# Patient Record
Sex: Male | Born: 1990 | Race: Black or African American | Hispanic: No | Marital: Single | Smoking: Current some day smoker
Health system: Southern US, Community
[De-identification: ages and names within clinical notes are randomized; demographics above are authoritative.]

## PROBLEM LIST (undated history)

## (undated) DIAGNOSIS — Z789 Other specified health status: Secondary | ICD-10-CM

---
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
## 2012-03-10 ENCOUNTER — Emergency Department (HOSPITAL_COMMUNITY)
Admission: EM | Admit: 2012-03-10 | Discharge: 2012-03-10 | Disposition: A | Discharge status: Home / Self Care | Payer: Self-pay | Attending: Emergency Medicine | Admitting: Emergency Medicine

## 2012-03-10 ENCOUNTER — Encounter (HOSPITAL_COMMUNITY): Payer: Self-pay | Admitting: *Deleted

## 2012-03-10 DIAGNOSIS — F17200 Nicotine dependence, unspecified, uncomplicated: Secondary | ICD-10-CM | POA: Insufficient documentation

## 2012-03-10 DIAGNOSIS — J309 Allergic rhinitis, unspecified: Secondary | ICD-10-CM | POA: Insufficient documentation

## 2012-03-10 MED ORDER — LORATADINE 10 MG PO TABS: 10.0000 mg | ORAL_TABLET | Freq: Every day | ORAL | Status: DC

## 2012-03-10 MED ORDER — LORATADINE 10 MG PO TABS
10.0000 mg | ORAL_TABLET | Freq: Once | ORAL | Status: DC
  Filled 2012-03-10 (×3): qty 1

## 2012-03-10 NOTE — ED Provider Notes (Signed)
History   This chart was scribed for Loren Racer, MD by Charolett Bumpers . The patient was seen in room TR04C/TR04C. Patient's care was started at 1336.    CSN: [id number]  Arrival date & time 03/10/12  1232   First MD Initiated Contact with Patient 03/10/12 1336      Chief Complaint  Patient presents with  . Sore Throat  . Nasal Congestion  . Rash    (Consider location/radiation/quality/duration/timing/severity/associated sxs/prior treatment) HPI Omar Chapman is a 21 y.o. male who presents to the Emergency Department complaining of constant, mild nasal congestion with associated throat irritation, rhinorrhea for the past 2 days ago. Pt also states that he had hives with associated itching and swelling last night located on his face. Pt reports recent exposure to dust bunnies and states that he tried a new soap yesterday. Pt states that he has not taken anything for his symptoms.   History reviewed. No pertinent past medical history.  History reviewed. No pertinent past surgical history.  History reviewed. No pertinent family history.  History  Substance Use Topics  . Smoking status: Current Some Day Smoker  . Smokeless tobacco: Not on file  . Alcohol Use: Yes     occ      Review of Systems A complete 10 system review of systems was obtained and all systems are negative except as noted in the HPI and PMH.   Allergies  Review of patient's allergies indicates no known allergies.  Home Medications   Current Outpatient Rx  Name Route Sig Dispense Refill  . LORATADINE 10 MG PO TABS Oral Take 1 tablet (10 mg total) by mouth daily. 20 tablet 0    BP 135/79  Pulse 60  Temp 98.1 F (36.7 C) (Oral)  Resp 16  SpO2 98%  Physical Exam  Nursing note and vitals reviewed. Constitutional: He is oriented to person, place, and time. He appears well-developed and well-nourished. No distress.  HENT:  Head: Normocephalic and atraumatic.  Mouth/Throat: Oropharynx is  clear and moist. No oropharyngeal exudate.       Swollen bilateral nasal turbinates.   Eyes: EOM are normal.  Neck: Normal range of motion. Neck supple. No tracheal deviation present.  Cardiovascular: Normal rate, regular rhythm and normal heart sounds.   Pulmonary/Chest: Effort normal and breath sounds normal. No respiratory distress. He has no wheezes.  Musculoskeletal: Normal range of motion.  Neurological: He is alert and oriented to person, place, and time.  Skin: Skin is warm and dry. No rash noted.       No evidence of any obvious rashes.   Psychiatric: He has a normal mood and affect. His behavior is normal.    ED Course  Procedures (including critical care time)  DIAGNOSTIC STUDIES: Oxygen Saturation is 98% on room air, normal by my interpretation.    COORDINATION OF CARE:  13:47-Discussed planned course of treatment with the patient including Claritin, who is agreeable at this time.   14:00-Medication Orders: Loratadine (Claritin) tablet 10 mg-once  Results for orders placed during the hospital encounter of 03/10/12  RAPID STREP SCREEN      Component Value Range   Streptococcus, Group A Screen (Direct) NEGATIVE  NEGATIVE   No results found.   1. Allergic rhinitis       MDM  I personally performed the services described in this documentation, which was scribed in my presence. The recorded information has been reviewed and considered.      Loren Racer, MD  03/10/12 1906 

## 2012-03-10 NOTE — ED Notes (Signed)
Pt reports nasal congestion and sore throat since this am, no fever no cough. Pt reports broke out in what he thought was hives this am. Airway intact.

## 2012-03-15 ENCOUNTER — Encounter (HOSPITAL_COMMUNITY): Payer: Self-pay | Admitting: Emergency Medicine

## 2012-03-15 DIAGNOSIS — F17200 Nicotine dependence, unspecified, uncomplicated: Secondary | ICD-10-CM | POA: Insufficient documentation

## 2012-03-15 DIAGNOSIS — K3580 Unspecified acute appendicitis: Secondary | ICD-10-CM | POA: Insufficient documentation

## 2012-03-15 LAB — COMPREHENSIVE METABOLIC PANEL
ALT: 10 U/L (ref 0–53)
AST: 17 U/L (ref 0–37)
Albumin: 4.5 g/dL (ref 3.5–5.2)
Alkaline Phosphatase: 64 U/L (ref 39–117)
BUN: 8 mg/dL (ref 6–23)
CO2: 25 mEq/L (ref 19–32)
Calcium: 10.3 mg/dL (ref 8.4–10.5)
Chloride: 100 mEq/L (ref 96–112)
Creatinine, Ser: 0.86 mg/dL (ref 0.50–1.35)
GFR calc Af Amer: >90 mL/min (ref >90)
GFR calc non Af Amer: >90 mL/min (ref >90)
Glucose, Bld: 89 mg/dL (ref 70–99)
Potassium: 3.6 mEq/L (ref 3.5–5.1)
Sodium: 139 mEq/L (ref 135–145)
Total Bilirubin: 0.8 mg/dL (ref 0.3–1.2)
Total Protein: 8.6 g/dL — ABNORMAL HIGH (ref 6.0–8.3)

## 2012-03-15 LAB — CBC WITH DIFFERENTIAL/PLATELET
Basophils Absolute: 0 10*3/uL (ref 0.0–0.1)
Basophils Relative: 0 % (ref 0–1)
Eosinophils Absolute: 0 10*3/uL (ref 0.0–0.7)
Eosinophils Relative: 0 % (ref 0–5)
HCT: 43.9 % (ref 39.0–52.0)
Hemoglobin: 15.7 g/dL (ref 13.0–17.0)
Lymphocytes Relative: 12 % (ref 12–46)
Lymphs Abs: 1.8 10*3/uL (ref 0.7–4.0)
MCH: 31.6 pg (ref 26.0–34.0)
MCHC: 35.8 g/dL (ref 30.0–36.0)
MCV: 88.3 fL (ref 78.0–100.0)
Monocytes Absolute: 1 10*3/uL (ref 0.1–1.0)
Monocytes Relative: 6 % (ref 3–12)
Neutro Abs: 12.5 10*3/uL — ABNORMAL HIGH (ref 1.7–7.7)
Neutrophils Relative %: 82 % — ABNORMAL HIGH (ref 43–77)
Platelets: 267 10*3/uL (ref 150–400)
RBC: 4.97 MIL/uL (ref 4.22–5.81)
RDW: 12.6 % (ref 11.5–15.5)
WBC: 15.4 10*3/uL — ABNORMAL HIGH (ref 4.0–10.5)

## 2012-03-15 LAB — URINALYSIS, MICROSCOPIC ONLY
Hgb urine dipstick: NEGATIVE
Ketones, ur: >80 mg/dL — AB
Protein, ur: 30 mg/dL — AB
Urobilinogen, UA: 0.2 mg/dL (ref 0.0–1.0)

## 2012-03-15 LAB — LIPASE, BLOOD: Lipase: 13 U/L (ref 11–59)

## 2012-03-15 NOTE — ED Notes (Signed)
Pt reports L groin pain radiates to LLQ pain; reports "little bit" of nausea, denies v/d; reports pain increases when uses bathroom

## 2012-03-16 ENCOUNTER — Encounter (HOSPITAL_COMMUNITY): Payer: Self-pay | Admitting: General Practice

## 2012-03-16 ENCOUNTER — Encounter (HOSPITAL_COMMUNITY): Payer: Self-pay | Admitting: Anesthesiology

## 2012-03-16 ENCOUNTER — Ambulatory Visit (HOSPITAL_COMMUNITY)
Admission: EM | Admit: 2012-03-16 | Discharge: 2012-03-17 | Disposition: A | Discharge status: Home / Self Care | Payer: Self-pay | Attending: General Surgery | Admitting: General Surgery

## 2012-03-16 ENCOUNTER — Emergency Department (HOSPITAL_COMMUNITY): Payer: Self-pay | Admitting: Anesthesiology

## 2012-03-16 ENCOUNTER — Emergency Department (HOSPITAL_COMMUNITY): Payer: Self-pay

## 2012-03-16 ENCOUNTER — Encounter (HOSPITAL_COMMUNITY)
Admission: EM | Disposition: A | Discharge status: Home / Self Care | Payer: Self-pay | Source: Home / Self Care | Attending: Emergency Medicine

## 2012-03-16 ENCOUNTER — Encounter (HOSPITAL_COMMUNITY): Payer: Self-pay | Admitting: General Surgery

## 2012-03-16 DIAGNOSIS — K3580 Unspecified acute appendicitis: Secondary | ICD-10-CM

## 2012-03-16 HISTORY — PX: APPENDECTOMY: SHX54

## 2012-03-16 HISTORY — PX: LAPAROSCOPIC APPENDECTOMY: SHX408

## 2012-03-16 HISTORY — DX: Other specified health status: Z78.9

## 2012-03-16 LAB — CBC
HCT: 38.2 % — ABNORMAL LOW (ref 39.0–52.0)
Hemoglobin: 13.3 g/dL (ref 13.0–17.0)
MCH: 30.9 pg (ref 26.0–34.0)
MCHC: 34.8 g/dL (ref 30.0–36.0)
MCV: 88.8 fL (ref 78.0–100.0)
Platelets: 220 10*3/uL (ref 150–400)
RBC: 4.3 MIL/uL (ref 4.22–5.81)
RDW: 12.6 % (ref 11.5–15.5)
WBC: 13 10*3/uL — ABNORMAL HIGH (ref 4.0–10.5)

## 2012-03-16 SURGERY — LAPAROSCOPIC TORSION OF APPENDIX EXCISION OF INFRACTED EPIPLOIC
Anesthesia: General

## 2012-03-16 SURGERY — APPENDECTOMY, LAPAROSCOPIC
Anesthesia: General | Site: Abdomen | Wound class: Clean Contaminated

## 2012-03-16 MED ORDER — HYDROMORPHONE HCL PF 1 MG/ML IJ SOLN
1.0000 mg | Freq: Once | INTRAMUSCULAR | Status: AC
  Administered 2012-03-16: 1 mg via INTRAVENOUS
  Filled 2012-03-16: qty 1

## 2012-03-16 MED ORDER — ONDANSETRON HCL 4 MG/2ML IJ SOLN
4.0000 mg | Freq: Four times a day (QID) | INTRAMUSCULAR | Status: DC | PRN
  Administered 2012-03-16: 4 mg via INTRAVENOUS
  Filled 2012-03-16 (×2): qty 2

## 2012-03-16 MED ORDER — NEOSTIGMINE METHYLSULFATE 1 MG/ML IJ SOLN
INTRAMUSCULAR | Status: DC | PRN
  Administered 2012-03-16: 3 mg via INTRAVENOUS

## 2012-03-16 MED ORDER — PROMETHAZINE HCL 25 MG/ML IJ SOLN: 6.2500 mg | INTRAMUSCULAR | Status: DC | PRN

## 2012-03-16 MED ORDER — ONDANSETRON HCL 4 MG PO TABS: 4.0000 mg | ORAL_TABLET | Freq: Four times a day (QID) | ORAL | Status: DC | PRN

## 2012-03-16 MED ORDER — PROPOFOL 10 MG/ML IV BOLUS
INTRAVENOUS | Status: DC | PRN
  Administered 2012-03-16: 170 mg via INTRAVENOUS

## 2012-03-16 MED ORDER — BUPIVACAINE-EPINEPHRINE 0.25% -1:200000 IJ SOLN
INTRAMUSCULAR | Status: DC | PRN
  Administered 2012-03-16: 14 mL

## 2012-03-16 MED ORDER — FENTANYL CITRATE 0.05 MG/ML IJ SOLN
INTRAMUSCULAR | Status: DC | PRN
  Administered 2012-03-16: 150 ug via INTRAVENOUS
  Administered 2012-03-16 (×2): 50 ug via INTRAVENOUS
  Administered 2012-03-16: 100 ug via INTRAVENOUS

## 2012-03-16 MED ORDER — OXYCODONE HCL 5 MG/5ML PO SOLN: 5.0000 mg | Freq: Once | ORAL | Status: DC | PRN

## 2012-03-16 MED ORDER — ONDANSETRON HCL 4 MG/2ML IJ SOLN
INTRAMUSCULAR | Status: DC | PRN
  Administered 2012-03-16: 4 mg via INTRAVENOUS

## 2012-03-16 MED ORDER — SODIUM CHLORIDE 0.9 % IR SOLN
Status: DC | PRN
  Administered 2012-03-16: 1000 mL

## 2012-03-16 MED ORDER — LIDOCAINE HCL (CARDIAC) 20 MG/ML IV SOLN
INTRAVENOUS | Status: DC | PRN
  Administered 2012-03-16: 80 mg via INTRAVENOUS

## 2012-03-16 MED ORDER — SODIUM CHLORIDE 0.9 % IV SOLN
INTRAVENOUS | Status: DC | PRN
  Administered 2012-03-16: 06:00:00 via INTRAVENOUS

## 2012-03-16 MED ORDER — SODIUM CHLORIDE 0.9 % IV SOLN
3.0000 g | Freq: Once | INTRAVENOUS | Status: AC
  Administered 2012-03-16: 3 g via INTRAVENOUS
  Filled 2012-03-16: qty 3

## 2012-03-16 MED ORDER — HYDROMORPHONE HCL PF 1 MG/ML IJ SOLN
INTRAMUSCULAR | Status: AC
  Filled 2012-03-16: qty 1

## 2012-03-16 MED ORDER — KCL IN DEXTROSE-NACL 20-5-0.9 MEQ/L-%-% IV SOLN
INTRAVENOUS | Status: DC
  Administered 2012-03-16: 11:00:00 via INTRAVENOUS
  Filled 2012-03-16 (×3): qty 1000

## 2012-03-16 MED ORDER — DEXTROSE 5 % IV SOLN
2.0000 g | INTRAVENOUS | Status: DC | PRN
  Administered 2012-03-16: 2 g via INTRAVENOUS

## 2012-03-16 MED ORDER — HYDROMORPHONE HCL PF 1 MG/ML IJ SOLN
0.2500 mg | INTRAMUSCULAR | Status: DC | PRN
  Administered 2012-03-16 (×2): 0.5 mg via INTRAVENOUS

## 2012-03-16 MED ORDER — SUCCINYLCHOLINE CHLORIDE 20 MG/ML IJ SOLN
INTRAMUSCULAR | Status: DC | PRN
  Administered 2012-03-16: 100 mg via INTRAVENOUS

## 2012-03-16 MED ORDER — SODIUM CHLORIDE 0.9 % IV BOLUS (SEPSIS)
1000.0000 mL | INTRAVENOUS | Status: AC
  Administered 2012-03-16: 1000 mL via INTRAVENOUS

## 2012-03-16 MED ORDER — MEPERIDINE HCL 25 MG/ML IJ SOLN: 6.2500 mg | INTRAMUSCULAR | Status: DC | PRN

## 2012-03-16 MED ORDER — IOHEXOL 300 MG/ML  SOLN
100.0000 mL | Freq: Once | INTRAMUSCULAR | Status: AC | PRN
  Administered 2012-03-16: 100 mL via INTRAVENOUS

## 2012-03-16 MED ORDER — GLYCOPYRROLATE 0.2 MG/ML IJ SOLN
INTRAMUSCULAR | Status: DC | PRN
  Administered 2012-03-16 (×2): 0.2 mg via INTRAVENOUS
  Administered 2012-03-16: 0.4 mg via INTRAVENOUS

## 2012-03-16 MED ORDER — OXYCODONE HCL 5 MG PO TABS: 5.0000 mg | ORAL_TABLET | Freq: Once | ORAL | Status: DC | PRN

## 2012-03-16 MED ORDER — BUPIVACAINE-EPINEPHRINE PF 0.25-1:200000 % IJ SOLN
INTRAMUSCULAR | Status: AC
  Filled 2012-03-16: qty 30

## 2012-03-16 MED ORDER — LACTATED RINGERS IV SOLN
INTRAVENOUS | Status: DC | PRN
  Administered 2012-03-16: 06:00:00 via INTRAVENOUS

## 2012-03-16 MED ORDER — MORPHINE SULFATE 4 MG/ML IJ SOLN: 4.0000 mg | INTRAMUSCULAR | Status: DC | PRN

## 2012-03-16 MED ORDER — HYDROCODONE-ACETAMINOPHEN 5-325 MG PO TABS
1.0000 | ORAL_TABLET | ORAL | Status: DC | PRN
  Administered 2012-03-16 (×2): 2 via ORAL
  Filled 2012-03-16 (×2): qty 2

## 2012-03-16 MED ORDER — IOHEXOL 300 MG/ML  SOLN
20.0000 mL | INTRAMUSCULAR | Status: AC
  Administered 2012-03-16: 20 mL via ORAL

## 2012-03-16 MED ORDER — ONDANSETRON HCL 4 MG/2ML IJ SOLN
4.0000 mg | Freq: Four times a day (QID) | INTRAMUSCULAR | Status: DC | PRN
  Administered 2012-03-16: 4 mg via INTRAVENOUS

## 2012-03-16 MED ORDER — ROCURONIUM BROMIDE 100 MG/10ML IV SOLN
INTRAVENOUS | Status: DC | PRN
  Administered 2012-03-16: 40 mg via INTRAVENOUS

## 2012-03-16 MED ORDER — MIDAZOLAM HCL 5 MG/5ML IJ SOLN
INTRAMUSCULAR | Status: DC | PRN
  Administered 2012-03-16: 2 mg via INTRAVENOUS

## 2012-03-16 SURGICAL SUPPLY — 35 items
APPLIER CLIP ROT 10 11.4 M/L (STAPLE)
BLADE SURG ROTATE 9660 (MISCELLANEOUS) IMPLANT
CANISTER SUCTION 2500CC (MISCELLANEOUS) ×2 IMPLANT
CHLORAPREP W/TINT 26ML (MISCELLANEOUS) ×2 IMPLANT
CLIP APPLIE ROT 10 11.4 M/L (STAPLE) IMPLANT
CLOTH BEACON ORANGE TIMEOUT ST (SAFETY) ×2 IMPLANT
COVER SURGICAL LIGHT HANDLE (MISCELLANEOUS) ×2 IMPLANT
CUTTER FLEX LINEAR 45M (STAPLE) ×2 IMPLANT
DECANTER SPIKE VIAL GLASS SM (MISCELLANEOUS) ×2 IMPLANT
DERMABOND ADVANCED (GAUZE/BANDAGES/DRESSINGS) ×1
DERMABOND ADVANCED .7 DNX12 (GAUZE/BANDAGES/DRESSINGS) ×1 IMPLANT
DRAPE UTILITY 15X26 W/TAPE STR (DRAPE) ×4 IMPLANT
ELECT REM PT RETURN 9FT ADLT (ELECTROSURGICAL) ×2
ELECTRODE REM PT RTRN 9FT ADLT (ELECTROSURGICAL) ×1 IMPLANT
ENDOLOOP SUT PDS II  0 18 (SUTURE)
ENDOLOOP SUT PDS II 0 18 (SUTURE) IMPLANT
GLOVE BIO SURGEON STRL SZ7.5 (GLOVE) ×2 IMPLANT
GOWN STRL NON-REIN LRG LVL3 (GOWN DISPOSABLE) ×4 IMPLANT
KIT BASIN OR (CUSTOM PROCEDURE TRAY) ×2 IMPLANT
KIT ROOM TURNOVER OR (KITS) ×2 IMPLANT
NS IRRIG 1000ML POUR BTL (IV SOLUTION) ×2 IMPLANT
PAD ARMBOARD 7.5X6 YLW CONV (MISCELLANEOUS) ×4 IMPLANT
POUCH SPECIMEN RETRIEVAL 10MM (ENDOMECHANICALS) ×2 IMPLANT
RELOAD STAPLE TA45 3.5 REG BLU (ENDOMECHANICALS) ×2 IMPLANT
SCALPEL HARMONIC ACE (MISCELLANEOUS) ×2 IMPLANT
SET IRRIG TUBING LAPAROSCOPIC (IRRIGATION / IRRIGATOR) ×2 IMPLANT
SPECIMEN JAR SMALL (MISCELLANEOUS) ×2 IMPLANT
SUT MNCRL AB 4-0 PS2 18 (SUTURE) ×2 IMPLANT
TOWEL OR 17X24 6PK STRL BLUE (TOWEL DISPOSABLE) ×2 IMPLANT
TOWEL OR 17X26 10 PK STRL BLUE (TOWEL DISPOSABLE) ×2 IMPLANT
TRAY FOLEY CATH 14FR (SET/KITS/TRAYS/PACK) ×2 IMPLANT
TRAY LAPAROSCOPIC (CUSTOM PROCEDURE TRAY) ×2 IMPLANT
TROCAR XCEL BLUNT TIP 100MML (ENDOMECHANICALS) ×2 IMPLANT
TROCAR XCEL NON-BLD 5MMX100MML (ENDOMECHANICALS) ×4 IMPLANT
WATER STERILE IRR 1000ML POUR (IV SOLUTION) ×2 IMPLANT

## 2012-03-16 NOTE — Progress Notes (Signed)
Day of Surgery  Subjective: States trhat he feels much better, no abdominal pain - BS or flatus. Has not eaten or ambulated yet.  Objective: Vital signs in last 24 hours: Temp:  [97.5 F (36.4 C)-99 F (37.2 C)] 98.2 F (36.8 C) (09/19 0930) Pulse Rate:  [48-63] 48  (09/19 0930) Resp:  [9-25] 16  (09/19 0930) BP: (107-136)/(46-72) 124/68 mmHg (09/19 0930) SpO2:  [98 %-100 %] 98 % (09/19 0847) Weight:  [150 lb (68.04 kg)] 150 lb (68.04 kg) (09/19 0105) Last BM Date: 03/15/12  Intake/Output from previous day: 09/18 0701 - 09/19 0700 In: 300 [I.V.:300] Out: -  Intake/Output this shift: Total I/O In: 800 [I.V.:800] Out: -   General appearance: alert, cooperative, appears stated age and no distress Chest: CTA bilaterally Cardiac: RRR, No M/R/G Abdomen: Soft, appropriately tender around surgical wound sites, no erythema, drainage or bloating, No BS, or flatus. Denies any N/V He has not eaten yet. VS: HD stable, afebrile WBC trending downward, H&H slight downward trend (likley dilutional)   Lab Results:   [facility] 03/16/12 1336 03/15/12 2059  WBC 13.0* 15.4*  HGB 13.3 15.7  HCT 38.2* 43.9  PLT 220 267   BMET  Basename 03/15/12 2059  NA 139  K 3.6  CL 100  CO2 25  GLUCOSE 89  BUN 8  CREATININE 0.86  CALCIUM 10.3   PT/INR No results found for this basename: LABPROT:2,INR:2 in the last 72 hours ABG No results found for this basename: PHART:2,PCO2:2,PO2:2,HCO3:2 in the last 72 hours  Studies/Results: Ct Abdomen Pelvis W Contrast  03/16/2012  *RADIOLOGY REPORT*  Clinical Data: Right side abdominal pain.  CT ABDOMEN AND PELVIS WITH CONTRAST  Technique:  Multidetector CT imaging of the abdomen and pelvis was performed following the standard protocol during bolus administration of intravenous contrast.  Contrast: OMNIPAQUE IOHEXOL 300 MG/ML  SOLN  Comparison: None.  Findings: Lung bases are clear.  No pleural or pleural effusion.  The patient has acute  appendicitis.  The appendix is dilated with periappendiceal inflammatory change and an appendicolith identified.  There is a fairly large volume of free pelvic fluid. No abscess is seen.  No free intraperitoneal air is identified. The stomach, small bowel and colon appear normal.  No lymphadenopathy is identified.  The gallbladder, liver, adrenal glands, spleen, pancreas and kidneys all appear normal.  There is no focal bony abnormality.  IMPRESSION: Study is positive for acute appendicitis without abscess. Although no free air is identified, the volume of free pelvic fluid is greater than typically seen in acute appendicitis raising the possibility of perforation.   Original Report Authenticated By: Bernadene Bell. Maricela Curet, M.D.     Anti-infectives: Anti-infectives     Start     Dose/Rate Route Frequency Ordered Stop   03/16/12 0330   Ampicillin-Sulbactam (UNASYN) 3 g in sodium chloride 0.9 % 100 mL IVPB        3 g 100 mL/hr over 60 Minutes Intravenous  Once 03/16/12 0321 03/16/12 0449          Assessment/Plan: s/p Procedure(s) (LRB) with comments: LAPAROSCOPIC TORSION OF APPENDIX EXCISION OF INFRACTED EPIPLOIC (N/A)  1. Mobilize 2. Encourage diet/po intake, continue IVF for now. 3. Minimize use of narcotics 4. Probable discharge in am if patient has return of Bowel function, and looks good clinically.     LOS: 0 days    Omar Chapman 03/16/2012

## 2012-03-16 NOTE — Progress Notes (Signed)
ARRIVED TO ROOM [id number]. A/OX4, MOVES SELF FROM STRETCHER TO BED WITHOUT DIFFICULTY, DENIES NAUSEA/PAIN, ORIENTED TO ROOM, FAMILY AT BEDSIDE

## 2012-03-16 NOTE — Progress Notes (Signed)
Patient seen and examined.  Having some postop n/v currently.  Keep on clear liquids for now.

## 2012-03-16 NOTE — H&P (Signed)
Omar Chapman is an 21 y.o. male.   Chief Complaint: abd pain HPI: 21 yo bm who presents to er with rlq pain that started last night. Pain has gotten worse. Denies nausea or vomiting. Denies fever or chills  History reviewed. No pertinent past medical history.  History reviewed. No pertinent past surgical history.  History reviewed. No pertinent family history. Social History:  reports that he has been smoking.  He does not have any smokeless tobacco history on file. He reports that he drinks alcohol. He reports that he does not use illicit drugs.  Allergies: No Known Allergies   (Not in a hospital admission)  Results for orders placed during the hospital encounter of 03/16/12 (from the past 48 hour(s))  CBC WITH DIFFERENTIAL     Status: Abnormal   Collection Time   03/15/12  8:59 PM      Component Value Range Comment   WBC 15.4 (*) 4.0 - 10.5 K/uL    RBC 4.97  4.22 - 5.81 MIL/uL    Hemoglobin 15.7  13.0 - 17.0 g/dL    HCT 16.1  09.6 - 04.5 %    MCV 88.3  78.0 - 100.0 fL    MCH 31.6  26.0 - 34.0 pg    MCHC 35.8  30.0 - 36.0 g/dL    RDW 40.9  81.1 - 91.4 %    Platelets 267  150 - 400 K/uL    Neutrophils Relative 82 (*) 43 - 77 %    Neutro Abs 12.5 (*) 1.7 - 7.7 K/uL    Lymphocytes Relative 12  12 - 46 %    Lymphs Abs 1.8  0.7 - 4.0 K/uL    Monocytes Relative 6  3 - 12 %    Monocytes Absolute 1.0  0.1 - 1.0 K/uL    Eosinophils Relative 0  0 - 5 %    Eosinophils Absolute 0.0  0.0 - 0.7 K/uL    Basophils Relative 0  0 - 1 %    Basophils Absolute 0.0  0.0 - 0.1 K/uL   COMPREHENSIVE METABOLIC PANEL     Status: Abnormal   Collection Time   03/15/12  8:59 PM      Component Value Range Comment   Sodium 139  135 - 145 mEq/L    Potassium 3.6  3.5 - 5.1 mEq/L    Chloride 100  96 - 112 mEq/L    CO2 25  19 - 32 mEq/L    Glucose, Bld 89  70 - 99 mg/dL    BUN 8  6 - 23 mg/dL    Creatinine, Ser 7.82  0.50 - 1.35 mg/dL    Calcium 95.6  8.4 - 10.5 mg/dL    Total Protein 8.6 (*) 6.0 -  8.3 g/dL    Albumin 4.5  3.5 - 5.2 g/dL    AST 17  0 - 37 U/L    ALT 10  0 - 53 U/L    Alkaline Phosphatase 64  39 - 117 U/L    Total Bilirubin 0.8  0.3 - 1.2 mg/dL    GFR calc non Af Amer >90  >90 mL/min    GFR calc Af Amer >90  >90 mL/min   LIPASE, BLOOD     Status: Normal   Collection Time   03/15/12  8:59 PM      Component Value Range Comment   Lipase 13  11 - 59 U/L   URINALYSIS, MICROSCOPIC ONLY  Status: Abnormal   Collection Time   03/15/12  9:07 PM      Component Value Range Comment   Color, Urine AMBER (*) YELLOW BIOCHEMICALS MAY BE AFFECTED BY COLOR   APPearance CLEAR  CLEAR    Specific Gravity, Urine 1.030  1.005 - 1.030    pH 6.0  5.0 - 8.0    Glucose, UA NEGATIVE  NEGATIVE mg/dL    Hgb urine dipstick NEGATIVE  NEGATIVE    Bilirubin Urine SMALL (*) NEGATIVE    Ketones, ur >80 (*) NEGATIVE mg/dL    Protein, ur 30 (*) NEGATIVE mg/dL    Urobilinogen, UA 0.2  0.0 - 1.0 mg/dL    Nitrite NEGATIVE  NEGATIVE    Leukocytes, UA SMALL (*) NEGATIVE    WBC, UA 7-10  <3 WBC/hpf    Bacteria, UA RARE  RARE    Squamous Epithelial / LPF RARE  RARE    Ct Abdomen Pelvis W Contrast  03/16/2012  *RADIOLOGY REPORT*  Clinical Data: Right side abdominal pain.  CT ABDOMEN AND PELVIS WITH CONTRAST  Technique:  Multidetector CT imaging of the abdomen and pelvis was performed following the standard protocol during bolus administration of intravenous contrast.  Contrast: OMNIPAQUE IOHEXOL 300 MG/ML  SOLN  Comparison: None.  Findings: Lung bases are clear.  No pleural or pleural effusion.  The patient has acute appendicitis.  The appendix is dilated with periappendiceal inflammatory change and an appendicolith identified.  There is a fairly large volume of free pelvic fluid. No abscess is seen.  No free intraperitoneal air is identified. The stomach, small bowel and colon appear normal.  No lymphadenopathy is identified.  The gallbladder, liver, adrenal glands, spleen, pancreas and kidneys all  appear normal.  There is no focal bony abnormality.  IMPRESSION: Study is positive for acute appendicitis without abscess. Although no free air is identified, the volume of free pelvic fluid is greater than typically seen in acute appendicitis raising the possibility of perforation.   Original Report Authenticated By: Bernadene Bell. Maricela Curet, M.D.     Review of Systems  Constitutional: Negative.   HENT: Negative.   Eyes: Negative.   Respiratory: Negative.   Cardiovascular: Negative.   Gastrointestinal: Positive for abdominal pain. Negative for vomiting and diarrhea.  Genitourinary: Negative.   Musculoskeletal: Negative.   Skin: Negative.   Neurological: Negative.   Endo/Heme/Allergies: Negative.   Psychiatric/Behavioral: Negative.     Blood pressure 124/63, pulse 58, temperature 98.4 F (36.9 C), temperature source Oral, resp. rate 16, height 5\' 11"  (1.803 m), weight 150 lb (68.04 kg), SpO2 100.00%. Physical Exam  Constitutional: He is oriented to person, place, and time. He appears well-developed and well-nourished.  HENT:  Head: Normocephalic and atraumatic.  Eyes: Conjunctivae normal and EOM are normal. Pupils are equal, round, and reactive to light.  Neck: Normal range of motion. Neck supple.  Cardiovascular: Normal rate, regular rhythm and normal heart sounds.   Respiratory: Effort normal and breath sounds normal.  GI: Soft. Bowel sounds are normal.       Tender with guarding rlq  Musculoskeletal: Normal range of motion.  Neurological: He is alert and oriented to person, place, and time.  Skin: Skin is warm and dry.  Psychiatric: He has a normal mood and affect. His behavior is normal.     Assessment/Plan Acute appendicitis. Because of the risk of perforation and sepsis i think he would benefit from having his appendix removed. i have discussed with him the risks and benefits  of surgery including some of the technical aspects and he understands and wishes to proceed. Will plan  for lap appy today  TOTH III,PAUL S 03/16/2012, 4:37 AM

## 2012-03-16 NOTE — Anesthesia Postprocedure Evaluation (Signed)
  Anesthesia Post-op Note  Patient: Omar Chapman  Procedure(s) Performed: Procedure(s) (LRB) with comments: APPENDECTOMY LAPAROSCOPIC (N/A)  Patient Location: PACU  Anesthesia Type: General  Level of Consciousness: awake and sedated  Airway and Oxygen Therapy: Patient Spontanous Breathing  Post-op Pain: mild  Post-op Assessment: Post-op Vital signs reviewed  Post-op Vital Signs: stable  Complications: No apparent anesthesia complications

## 2012-03-16 NOTE — Progress Notes (Signed)
Report given to phillip rn as caregiver 

## 2012-03-16 NOTE — ED Provider Notes (Signed)
History     CSN: [id number]  Arrival date & time 03/15/12  2049   First MD Initiated Contact with Patient 03/16/12 0022      Chief Complaint  Patient presents with  . Abdominal Pain    (Consider location/radiation/quality/duration/timing/severity/associated sxs/prior treatment) HPI Comments: 21 year old male with no medical problems complains of pain in the right lower quadrant which started approximately 30 hours ago, was gradual in onset, gradually getting worse, constant, achy and 9/10 in intensity. He denies any rectal bleeding, dysuria, fevers or chills, has had decreased appetite today and notes that his last meal was 8 hours ago. He has no history of abdominal surgery.  Patient is a 21 y.o. male presenting with abdominal pain. The history is provided by the patient and a relative.  Abdominal Pain The primary symptoms of the illness include abdominal pain.    History reviewed. No pertinent past medical history.  History reviewed. No pertinent past surgical history.  History reviewed. No pertinent family history.  History  Substance Use Topics  . Smoking status: Current Some Day Smoker  . Smokeless tobacco: Not on file  . Alcohol Use: Yes     occ      Review of Systems  Gastrointestinal: Positive for abdominal pain.  All other systems reviewed and are negative.    Allergies  Review of patient's allergies indicates no known allergies.  Home Medications  No current outpatient prescriptions on file.  BP 136/59  Pulse 63  Temp 99 F (37.2 C) (Oral)  Resp 16  Ht 5\' 11"  (1.803 m)  Wt 150 lb (68.04 kg)  BMI 20.92 kg/m2  SpO2 100%  Physical Exam  Nursing note and vitals reviewed. Constitutional: He appears well-developed and well-nourished. No distress.  HENT:  Head: Normocephalic and atraumatic.  Mouth/Throat: Oropharynx is clear and moist. No oropharyngeal exudate.  Eyes: Conjunctivae normal and EOM are normal. Pupils are equal, round, and reactive to  light. Right eye exhibits no discharge. Left eye exhibits no discharge. No scleral icterus.  Neck: Normal range of motion. Neck supple. No JVD present. No thyromegaly present.  Cardiovascular: Normal rate, regular rhythm, normal heart sounds and intact distal pulses.  Exam reveals no gallop and no friction rub.   No murmur heard. Pulmonary/Chest: Effort normal and breath sounds normal. No respiratory distress. He has no wheezes. He has no rales.  Abdominal: Soft. Bowel sounds are normal. He exhibits no distension and no mass. There is tenderness (focal tenderness to the right lower cautery and at McBurney's point and suprapubic.). There is guarding.       No CVA tenderness, no peritoneal signs  Genitourinary:       Normal appearing penis and scrotum, no hernias palpated  Musculoskeletal: Normal range of motion. He exhibits no edema and no tenderness.  Lymphadenopathy:    He has no cervical adenopathy.  Neurological: He is alert. Coordination normal.  Skin: Skin is warm and dry. No rash noted. No erythema.  Psychiatric: He has a normal mood and affect. His behavior is normal.    ED Course  Procedures (including critical care time)  Labs Reviewed  CBC WITH DIFFERENTIAL - Abnormal; Notable for the following:    WBC 15.4 (*)     Neutrophils Relative 82 (*)     Neutro Abs 12.5 (*)     All other components within normal limits  COMPREHENSIVE METABOLIC PANEL - Abnormal; Notable for the following:    Total Protein 8.6 (*)     All other  components within normal limits  URINALYSIS, MICROSCOPIC ONLY - Abnormal; Notable for the following:    Color, Urine AMBER (*)  BIOCHEMICALS MAY BE AFFECTED BY COLOR   Bilirubin Urine SMALL (*)     Ketones, ur >80 (*)     Protein, ur 30 (*)     Leukocytes, UA SMALL (*)     All other components within normal limits  LIPASE, BLOOD   Ct Abdomen Pelvis W Contrast  03/16/2012  *RADIOLOGY REPORT*  Clinical Data: Right side abdominal pain.  CT ABDOMEN AND  PELVIS WITH CONTRAST  Technique:  Multidetector CT imaging of the abdomen and pelvis was performed following the standard protocol during bolus administration of intravenous contrast.  Contrast: OMNIPAQUE IOHEXOL 300 MG/ML  SOLN  Comparison: None.  Findings: Lung bases are clear.  No pleural or pleural effusion.  The patient has acute appendicitis.  The appendix is dilated with periappendiceal inflammatory change and an appendicolith identified.  There is a fairly large volume of free pelvic fluid. No abscess is seen.  No free intraperitoneal air is identified. The stomach, small bowel and colon appear normal.  No lymphadenopathy is identified.  The gallbladder, liver, adrenal glands, spleen, pancreas and kidneys all appear normal.  There is no focal bony abnormality.  IMPRESSION: Study is positive for acute appendicitis without abscess. Although no free air is identified, the volume of free pelvic fluid is greater than typically seen in acute appendicitis raising the possibility of perforation.   Original Report Authenticated By: Bernadene Bell. D'ALESSIO, M.D.      1. Acute appendicitis       MDM  Concerned for appendicitis, vital signs are normal, no fever, no tachycardia and normal blood pressure. Labs suggest a leukocytosis of 15,400, urinalysis with 80+ ketones, basic metabolic panel with no liver function elevation or renal dysfunction. IV fluids ordered, pain medication, CT scan.  Discussed with general surgeon Dr. Carolynne Edouard who will come see the patient for admission. CT scan shows acute appendicitis without obvious perforation. Antibiotics ordered, pain medications given, n.p.o. status verified.      Vida Roller, MD 03/16/12 [id number]

## 2012-03-16 NOTE — Transfer of Care (Signed)
Immediate Anesthesia Transfer of Care Note  Patient: Omar Chapman  Procedure(s) Performed: Procedure(s) (LRB) with comments: APPENDECTOMY LAPAROSCOPIC (N/A)  Patient Location: PACU  Anesthesia Type: General  Level of Consciousness: awake, sedated and patient cooperative  Airway & Oxygen Therapy: Patient Spontanous Breathing and Patient connected to nasal cannula oxygen  Post-op Assessment: Report given to PACU RN, Post -op Vital signs reviewed and stable and Patient moving all extremities  Post vital signs: Reviewed and stable  Complications: No apparent anesthesia complications

## 2012-03-16 NOTE — Op Note (Signed)
03/16/2012  6:50 AM  PATIENT:  Omar Chapman  21 y.o. male  PRE-OPERATIVE DIAGNOSIS:  Appendicitis, acute [540.9]  POST-OPERATIVE DIAGNOSIS:  Appendicitis, acute [540.9]  PROCEDURE:  Procedure(s) (LRB) with comments: APPENDECTOMY LAPAROSCOPIC (N/A)  SURGEON:  Surgeon(s) and Role:    * Robyne Askew, MD - Primary  PHYSICIAN ASSISTANT:   ASSISTANTS: none   ANESTHESIA:   general  EBL:  Total I/O In: 300 [I.V.:300] Out: -   BLOOD ADMINISTERED:none  DRAINS: none   LOCAL MEDICATIONS USED:  MARCAINE     SPECIMEN:  Source of Specimen:  appendix  DISPOSITION OF SPECIMEN:  PATHOLOGY  COUNTS:  YES  TOURNIQUET:  * No tourniquets in log *  DICTATION: .Dragon Dictation After informed consent was obtained patient was brought to the operating room placed in the supine position on the operating room table. After adequate induction of general anesthesia the patient's abdomen was prepped with ChloraPrep, allowed to dry, and draped in usual sterile manner. The area below the umbilicus was infiltrated with quarter percent Marcaine. A small incision was made with a 15 blade knife. This incision was carried down through the subcutaneous tissue bluntly with a hemostat and [employer] retractors until the linea alba was identified. The linea alba was incised with a 15 blade knife. Each side was grasped Coker clamps and elevated anteriorly. The preperitoneal space was probed bluntly with a hemostat until the peritoneum was opened and access was gained to the abdominal cavity. A 0 Vicryl purse string stitch was placed in the fascia surrounding the opening. A Hassan cannula was placed through the opening and anchored in place with the previously placed Vicryl purse string stitch. The laparoscope was placed through the [facility] cannula. The abdomen was insufflated with carbon dioxide without difficulty. nnext the suprapubic area was infiltrated with quarter percent Marcaine. A small incision was made with a  15 blade knife. A 5 mm port was placed bluntly through this incision into the abdominal cavity. A site was then chosen above the umbilicus for placement of a 5 mm port. The area was infiltrated with quarter percent Marcaine. A small stab incision was made with a 15 blade knife. A 5 mm port was placed bluntly through this incision and the abdominal cavity under direct vision. The laparoscope was then moved to the suprapubic port. Using a Glassman grasper and harmonic scalpel the right lower quadrant was inspected. The appendix was readily identified. The appendix was elevated anteriorly and the mesoappendix was taken down sharply with the harmonic scalpel. Once the base of the appendix where it joined the cecum was identified and cleared of any tissue then a laparoscopic GIA blue load 6 row stapler was placed through the [facility] cannula. The stapler was placed across the base of the appendix clamped and fired thereby dividing the base of the appendix between staple lines. A laparoscopic bag was then inserted through the [facility] cannula. The appendix was placed within the bag and the bag was sealed. The abdomen was then irrigated with copious amounts of saline until the effluent was clear. No other abnormalities were noted. The appendix and bag were removed with the [facility] cannula through the infraumbilical port without difficulty. The fascial defect was closed with the previously placed Vicryl pursestring stitch as well as with another interrupted 0 Vicryl figure-of-eight stitch. The rest of the ports were removed under direct vision and were found to be hemostatic. The gas was allowed to escape. The skin incisions were closed with interrupted 4-0  Monocryl subcuticular stitches. Dermabond dressings were applied. The patient tolerated the procedure well. At the end of the case all needle sponge and instrument counts were correct. The patient was then awakened and taken to recovery in stable condition.   PLAN OF CARE:  Admit for overnight observation  PATIENT DISPOSITION:  PACU - hemodynamically stable.   Delay start of Pharmacological VTE agent (>24hrs) due to surgical blood loss or risk of bleeding: not applicable

## 2012-03-16 NOTE — Anesthesia Preprocedure Evaluation (Addendum)
Anesthesia Evaluation  Patient identified by MRN, date of birth, ID band Patient awake    Reviewed: Allergy & Precautions, H&P , NPO status , Patient's Chart, lab work & pertinent test results  History of Anesthesia Complications Negative for: history of anesthetic complications  Airway Mallampati: I      Dental  (+) Teeth Intact   Pulmonary Current Smoker,  breath sounds clear to auscultation        Cardiovascular negative cardio ROS  Rhythm:Regular Rate:Normal     Neuro/Psych negative neurological ROS     GI/Hepatic negative GI ROS, Neg liver ROS,   Endo/Other  negative endocrine ROS  Renal/GU negative Renal ROS  negative genitourinary   Musculoskeletal negative musculoskeletal ROS (+)   Abdominal   Peds  Hematology negative hematology ROS (+)   Anesthesia Other Findings   Reproductive/Obstetrics                          Anesthesia Physical Anesthesia Plan  ASA: II and Emergent  Anesthesia Plan: General   Post-op Pain Management:    Induction: Intravenous, Rapid sequence and Cricoid pressure planned  Airway Management Planned: Oral ETT  Additional Equipment:   Intra-op Plan:   Post-operative Plan: Extubation in OR  Informed Consent: I have reviewed the patients History and Physical, chart, labs and discussed the procedure including the risks, benefits and alternatives for the proposed anesthesia with the patient or authorized representative who has indicated his/her understanding and acceptance.   Dental advisory given  Plan Discussed with: CRNA, Anesthesiologist and Surgeon  Anesthesia Plan Comments:         Anesthesia Quick Evaluation

## 2012-03-17 ENCOUNTER — Encounter (HOSPITAL_COMMUNITY): Payer: Self-pay | Admitting: General Surgery

## 2012-03-17 MED ORDER — HYDROCODONE-ACETAMINOPHEN 5-325 MG PO TABS: 1.0000 | ORAL_TABLET | ORAL | Status: AC | PRN

## 2012-03-17 NOTE — Discharge Summary (Signed)
Physician Discharge Summary  Patient ID: Omar Chapman MRN: [id number] DOB/AGE: 16-Feb-1991 20 y.o.  Admit date: 03/16/2012 Discharge date: 03/17/2012  Admission Diagnoses: RLQ abdominal pain  Discharge Diagnoses: S/p appendectomy Active Problems:  * No active hospital problems. *    Discharged Condition: stable  Hospital Course: This is a 21 yo bm who presented to [facility] on 03/16/12 with rlq pain that started the previous night. Patient stated that the pain has gotten worse. Denied nausea or vomiting. Denied fever or chills, CT report done 03/16/12: IMPRESSION:  Study is positive for acute appendicitis without abscess. Although  no free air is identified, the volume of free pelvic fluid is  greater than typically seen in acute appendicitis raising the  possibility of perforation. Based on CT results and clinical presentation the patient was taken to the operating room for an appendectomy, which he underwent w/o complications. Initially post op he had some residual N/V related to anesthesia, this has cleared, he is ambulating, tolerating regular diet and is w/o abdominal pain. He has remained afebrile and HD stable post op and is now stable for discharge to home/self care with planned follow up with Dr. Carolynne Edouard in  2-3 weeks time.     Consults: None  Significant Diagnostic Studies: labs, microbiology, and radiology.  Treatments: IV hydration, antibiotics, analgesia, and surgery.  Discharge Exam: Blood pressure 119/67, pulse 49, temperature 98.2 F (36.8 C), temperature source Oral, resp. rate 16, height 5\' 11"  (1.803 m), weight 150 lb (68.04 kg), SpO2 99.00%. General appearance: alert, cooperative, appears stated age and no distress Chest: CTA bilaterally Cardiac: RRR Abdomen: soft, non tender, + BS, BM, Flatus All surgical wounds are w/o erythema or drainage.  Disposition: 01-Home or Self Care     Medication List     As of 03/17/2012 10:40 AM       Signed: Blenda Mounts 03/17/2012, 10:40 AM

## 2012-03-17 NOTE — Discharge Summary (Signed)
Agree with discharge summary.

## 2013-03-30 IMAGING — CT CT ABD-PELV W/ CM
2 of 3 series · 14 of 32 positions shown, 19 images · IV contrast (omnipaque)
Comparison: None.

CLINICAL DATA: Right side abdominal pain.

CT ABDOMEN AND PELVIS WITH CONTRAST
TECHNIQUE: Multidetector CT imaging of the abdomen and pelvis was
performed following the standard protocol during bolus
administration of intravenous contrast.
Contrast: 100mL OMNIPAQUE IOHEXOL 300 MG/ML  SOLN

[Series 2: routine abdomen · axial · 0.70mm/px · z∈[-545,-190]mm · 7 of 95 slices shown, 12 images]
[im 12/95  soft-tissue]
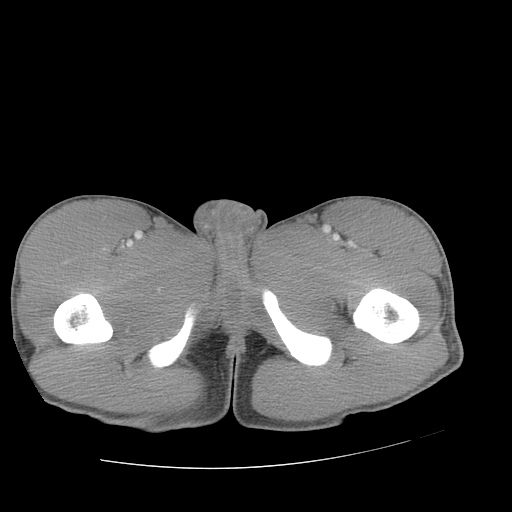
[im 12/95  bone]
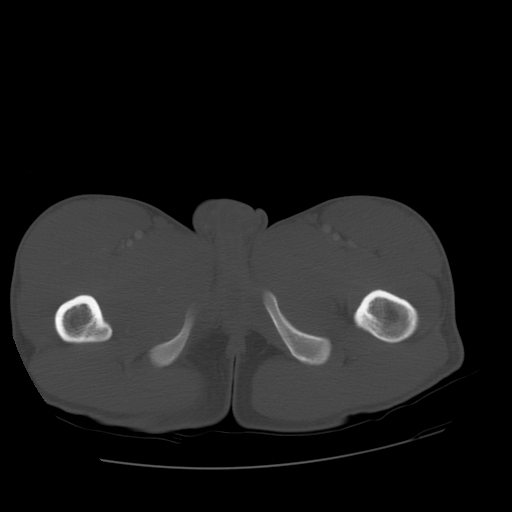
[im 24/95  soft-tissue]
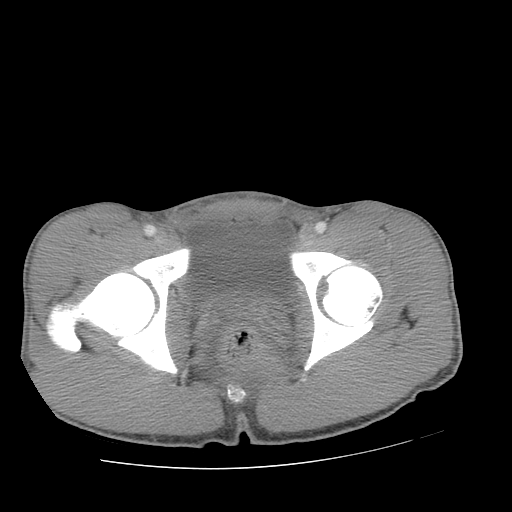
[im 36/95  soft-tissue]
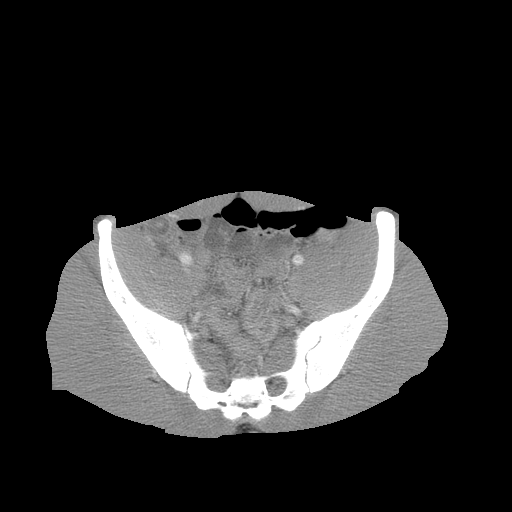
[im 48/95  soft-tissue]
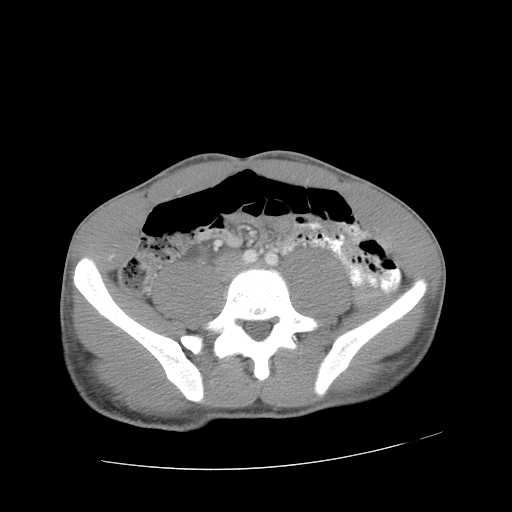
[im 48/95  lung]
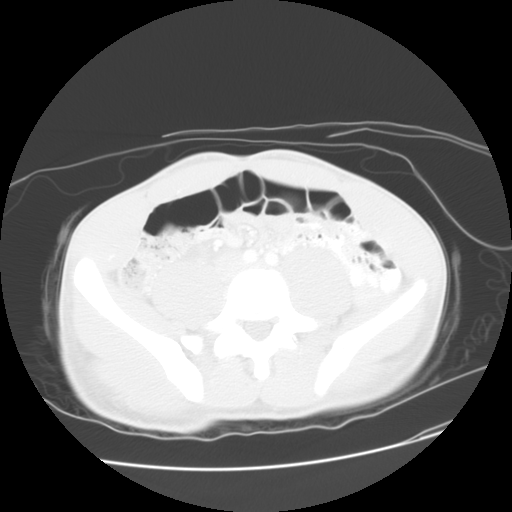
[im 59/95  soft-tissue]
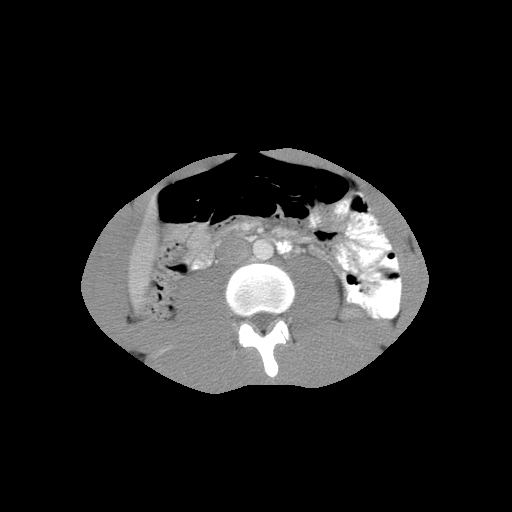
[im 59/95  lung]
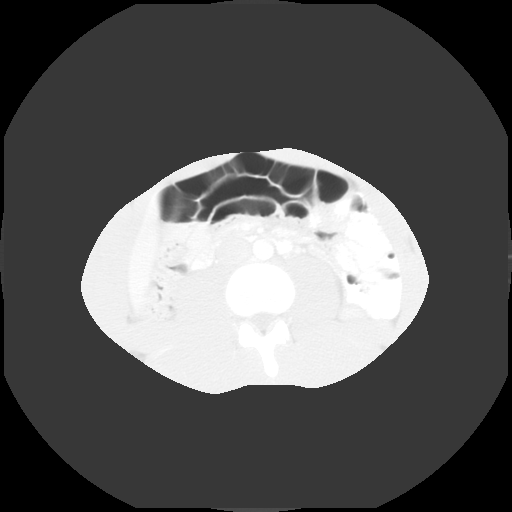
[im 71/95  soft-tissue]
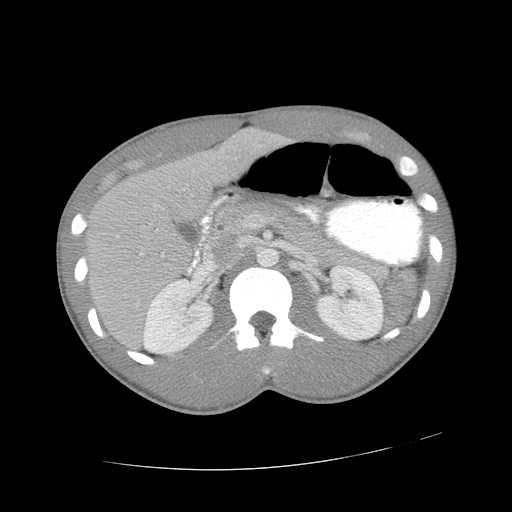
[im 71/95  lung]
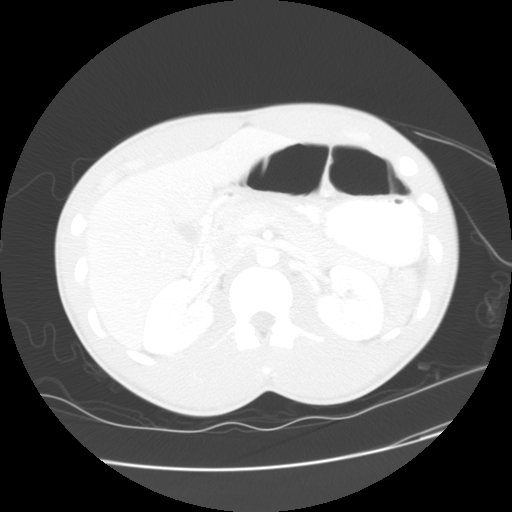
[im 83/95  soft-tissue]
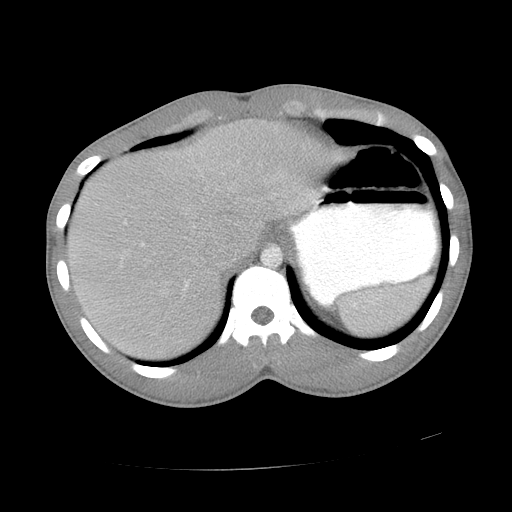
[im 83/95  lung]
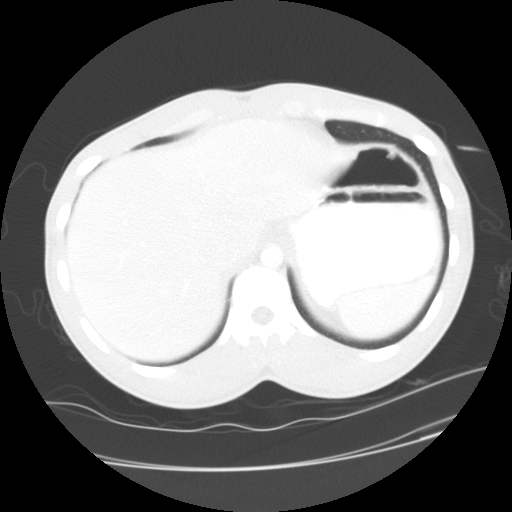

[Series 401: sag · sagittal · 0.94mm/px · 7 of 149 slices shown]
[im 13/149  soft-tissue]
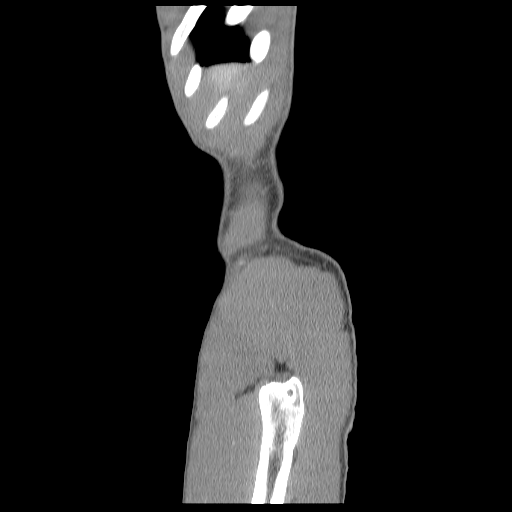
[im 38/149  soft-tissue]
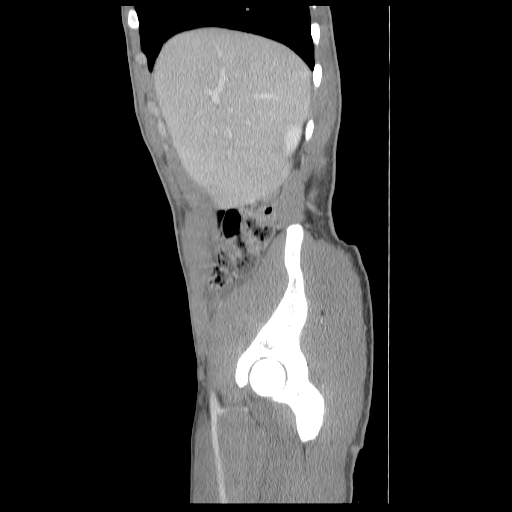
[im 50/149  soft-tissue]
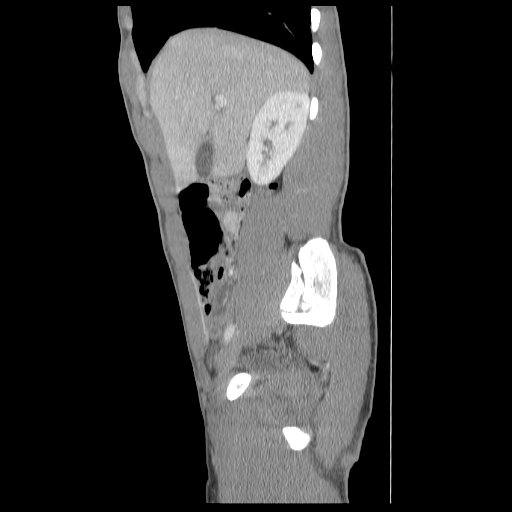
[im 62/149  soft-tissue]
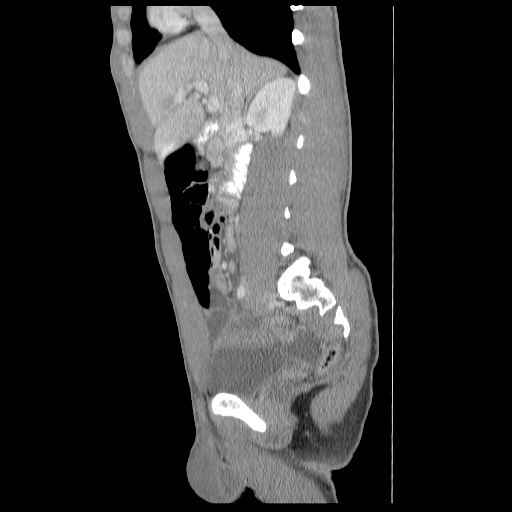
[im 87/149  soft-tissue]
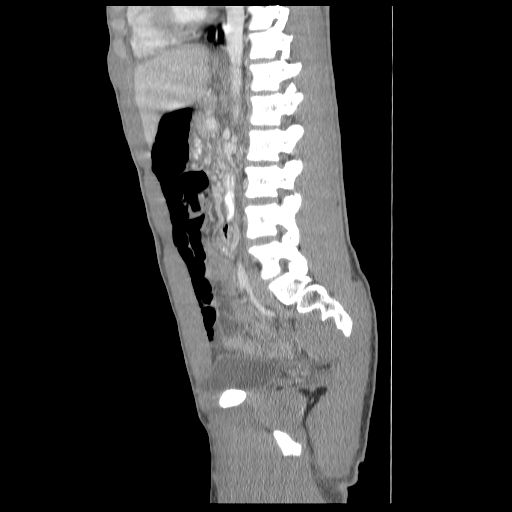
[im 99/149  soft-tissue]
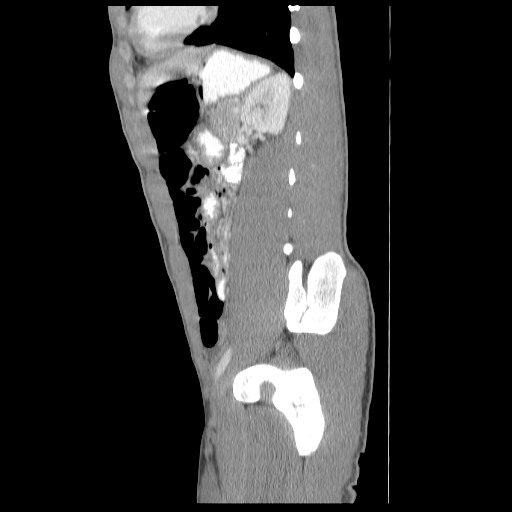
[im 112/149  soft-tissue]
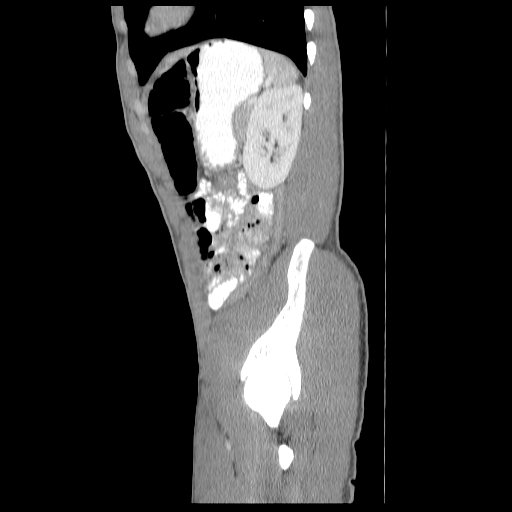

[14 of 32 positions shown; findings below may reference images not displayed]

FINDINGS: Lung bases are clear.  No pleural or pleural effusion.

The patient has acute appendicitis.  The appendix is dilated with
periappendiceal inflammatory change and an appendicolith
identified.  There is a fairly large volume of free pelvic fluid.
No abscess is seen.  No free intraperitoneal air is identified.
The stomach, small bowel and colon appear normal.  No
lymphadenopathy is identified.

The gallbladder, liver, adrenal glands, spleen, pancreas and
kidneys all appear normal.  There is no focal bony abnormality.
IMPRESSION: Study is positive for acute appendicitis without abscess. Although
no free air is identified, the volume of free pelvic fluid is
greater than typically seen in acute appendicitis raising the
possibility of perforation.

## 2014-07-11 ENCOUNTER — Encounter (HOSPITAL_COMMUNITY): Payer: Self-pay | Admitting: General Surgery

## 2016-04-09 ENCOUNTER — Encounter (HOSPITAL_COMMUNITY): Payer: Self-pay | Admitting: Emergency Medicine

## 2016-04-09 ENCOUNTER — Ambulatory Visit (HOSPITAL_COMMUNITY)
Admission: EM | Admit: 2016-04-09 | Discharge: 2016-04-09 | Disposition: A | Discharge status: Home / Self Care | Payer: 59 | Attending: Emergency Medicine | Admitting: Emergency Medicine

## 2016-04-09 DIAGNOSIS — Z202 Contact with and (suspected) exposure to infections with a predominantly sexual mode of transmission: Secondary | ICD-10-CM

## 2016-04-09 DIAGNOSIS — R112 Nausea with vomiting, unspecified: Secondary | ICD-10-CM

## 2016-04-09 DIAGNOSIS — R109 Unspecified abdominal pain: Secondary | ICD-10-CM | POA: Insufficient documentation

## 2016-04-09 LAB — POCT URINALYSIS DIP (DEVICE)
Bilirubin Urine: NEGATIVE
GLUCOSE, UA: NEGATIVE mg/dL
Hgb urine dipstick: NEGATIVE
KETONES UR: NEGATIVE mg/dL
LEUKOCYTES UA: NEGATIVE
Nitrite: NEGATIVE
Protein, ur: 30 mg/dL — AB
SPECIFIC GRAVITY, URINE: 1.02 (ref 1.005–1.030)
UROBILINOGEN UA: 1 mg/dL (ref 0.0–1.0)
pH: 7 (ref 5.0–8.0)

## 2016-04-09 MED ORDER — ONDANSETRON HCL 4 MG PO TABS: 4.0000 mg | ORAL_TABLET | Freq: Four times a day (QID) | ORAL | 0 refills | Status: AC

## 2016-04-09 NOTE — ED Provider Notes (Signed)
CSN: [id number]     Arrival date & time 04/09/16  1906 History   None    Chief Complaint  Patient presents with  . Abdominal Pain  . Exposure to STD   (Consider location/radiation/quality/duration/timing/severity/associated sxs/prior Treatment) 25 YR OLD AA MALE PRESENTS TO UC WITH CC OF NAUSEA/VOMITING X 1 AT WORK, NEED WORK NOTE AND STD CHECK. PT STATES HE HAS A NEW PARTNER 2 WEEKS PRIOR UNPROTECTED INTERCOURSE,CONCERN HE HAS STD. DENIES FEVER, DYSURIA OR DISCHARGE   The history is provided by the patient.    Past Medical History:  Diagnosis Date  . No pertinent past medical history    Past Surgical History:  Procedure Laterality Date  . APPENDECTOMY  03/16/2012  . LAPAROSCOPIC APPENDECTOMY  03/16/2012   Procedure: APPENDECTOMY LAPAROSCOPIC;  Surgeon: Robyne Askew, MD;  Location: [facility] OR;  Service: General;  Laterality: N/A;   No family history on file. Social History  Substance Use Topics  . Smoking status: Current Some Day Smoker    Packs/day: 0.12    Years: 1.00    Types: Cigarettes  . Smokeless tobacco: Never Used  . Alcohol use No     Comment: 03/16/2012 "don't drink alcohol"    Review of Systems  Constitutional: Negative for fever.  HENT: Negative.   Eyes: Negative.   Respiratory: Negative.   Cardiovascular: Negative.   Gastrointestinal: Positive for nausea and vomiting. Negative for abdominal distention, abdominal pain, anal bleeding, blood in stool, constipation, diarrhea and rectal pain.  Endocrine: Negative.   Genitourinary: Negative for decreased urine volume, difficulty urinating, discharge, dysuria, enuresis, flank pain, frequency, genital sores, hematuria, penile pain, penile swelling, scrotal swelling, testicular pain and urgency.  Musculoskeletal: Negative.   Skin: Negative for rash.  Allergic/Immunologic: Negative.   Neurological: Negative.   Hematological: Negative.   Psychiatric/Behavioral: The patient is nervous/anxious.   All other systems  reviewed and are negative.   Allergies  Review of patient's allergies indicates no known allergies.  Home Medications   Prior to Admission medications   Medication Sig Start Date End Date Taking? Authorizing Provider  HYDROcodone-acetaminophen (NORCO/VICODIN) 5-325 MG per tablet Take 1-2 tablets by mouth every 4 (four) hours as needed. 03/17/12   Blenda Mounts, NP  ondansetron (ZOFRAN) 4 MG tablet Take 1 tablet (4 mg total) by mouth every 6 (six) hours. 04/09/16   Clancy Gourd, NP   Meds Ordered and Administered this Visit  Medications - No data to display  BP (!) 135/48 (BP Location: Left Arm) Comment: notified rn   Pulse 60   Temp 98.9 F (37.2 C) (Oral)   Resp 16   SpO2 100%  No data found.   Physical Exam  Constitutional: Vital signs are normal. He appears well-developed and well-nourished. He is active and cooperative.  Non-toxic appearance. He does not have a sickly appearance. He does not appear ill. No distress.  HENT:  Head: Normocephalic.  Pulmonary/Chest: Effort normal.  Abdominal: Soft. Normal appearance and bowel sounds are normal. There is no tenderness.  Genitourinary: Testes normal and penis normal.  Genitourinary Comments: Chaperoned by Gerilyn Nestle  Musculoskeletal: Normal range of motion.  Neurological: He is alert. No cranial nerve deficit or sensory deficit. GCS eye subscore is 4. GCS verbal subscore is 5. GCS motor subscore is 6.  Psychiatric: He has a normal mood and affect. His speech is normal.  Nursing note and vitals reviewed.   Urgent Care Course   Clinical Course  Comment By Time  ISOLATED OR TRANSIENT, NOTED Para March  Onedia Vargus, NP 10/13 2100    Procedures (including critical care time)  Labs Review Labs Reviewed  POCT URINALYSIS DIP (DEVICE) - Abnormal; Notable for the following:       Result Value   Protein, ur 30 (*)    All other components within normal limits  URINE CYTOLOGY ANCILLARY ONLY   Recent Results (from the past 2160  hour(s))  POCT urinalysis dip (device)     Status: Abnormal   Collection Time: 04/09/16  8:17 PM  Result Value Ref Range   Glucose, UA NEGATIVE NEGATIVE mg/dL   Bilirubin Urine NEGATIVE NEGATIVE   Ketones, ur NEGATIVE NEGATIVE mg/dL   Specific Gravity, Urine 1.020 1.005 - 1.030   Hgb urine dipstick NEGATIVE NEGATIVE   pH 7.0 5.0 - 8.0   Protein, ur 30 (A) NEGATIVE mg/dL   Urobilinogen, UA 1.0 0.0 - 1.0 mg/dL   Nitrite NEGATIVE NEGATIVE   Leukocytes, UA NEGATIVE NEGATIVE    Comment: Biochemical Testing Only. Please order routine urinalysis from main lab if confirmatory testing is needed.  Imaging Review No results found.        MDM   1. Possible exposure to STD   2. Non-intractable vomiting with nausea, unspecified vomiting type    Offered pt STD treatment or wait until cx returned with negative since UA negative except proteinuria, pt opted to wait. Work note and Zofran given. Pt verbalized understanding to this provider.    Clancy GourdJeanette Jadelynn Boylan, NP 04/09/16 2232

## 2016-04-09 NOTE — Discharge Instructions (Signed)
YOUR CULTURES ARE PENDING. FOLLOW UP WITH HEALTH DEPARTMENT FOR ADDITIONAL STD TESTING. RETURN TO UC AS NEEDED. BLAND DIET. ZOFRAN FOR NAUSEA. GO TO ER FOR WORSENING SYMPTOMS. WE WILL NOTIFY YOU IF CULTURES COME BACK POSITIVE FOR STD AND TREATMENT IS INDICATED.

## 2016-04-09 NOTE — ED Triage Notes (Signed)
Pt here for abd pain onset today associated w/vomiting  Reports he needs note for work  Also concerned for STD  States he is SA in a monogamous relationship

## 2016-04-09 NOTE — ED Notes (Signed)
Call back number verified and updated in EPIC... Adv pt to not have SI until lab results comeback neg.... Also adv pt lab results will be on MyChart; instructions given .... Pt verb understanding.   

## 2016-04-12 LAB — URINE CYTOLOGY ANCILLARY ONLY
Chlamydia: NEGATIVE
Neisseria Gonorrhea: NEGATIVE
Trichomonas: NEGATIVE
